# Patient Record
Sex: Male | Born: 2000 | Race: Black or African American | Hispanic: No | Marital: Single | Smoking: Never smoker
Health system: Southern US, Community
[De-identification: ages and names within clinical notes are randomized; demographics above are authoritative.]

## PROBLEM LIST (undated history)

## (undated) DIAGNOSIS — F909 Attention-deficit hyperactivity disorder, unspecified type: Secondary | ICD-10-CM

---
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
## 2019-06-02 ENCOUNTER — Other Ambulatory Visit: Payer: Self-pay

## 2019-06-02 ENCOUNTER — Encounter (HOSPITAL_COMMUNITY): Payer: Self-pay | Admitting: Emergency Medicine

## 2019-06-02 ENCOUNTER — Ambulatory Visit (HOSPITAL_COMMUNITY)
Admission: EM | Admit: 2019-06-02 | Discharge: 2019-06-02 | Disposition: A | Discharge status: Home / Self Care | Payer: 59 | Attending: Family Medicine | Admitting: Family Medicine

## 2019-06-02 DIAGNOSIS — J069 Acute upper respiratory infection, unspecified: Secondary | ICD-10-CM | POA: Diagnosis not present

## 2019-06-02 DIAGNOSIS — R059 Cough, unspecified: Secondary | ICD-10-CM

## 2019-06-02 DIAGNOSIS — J029 Acute pharyngitis, unspecified: Secondary | ICD-10-CM | POA: Diagnosis not present

## 2019-06-02 DIAGNOSIS — R05 Cough: Secondary | ICD-10-CM | POA: Insufficient documentation

## 2019-06-02 LAB — POCT RAPID STREP A: Streptococcus, Group A Screen (Direct): NEGATIVE

## 2019-06-02 MED ORDER — PROMETHAZINE-DM 6.25-15 MG/5ML PO SYRP: 5.0000 mL | ORAL_SOLUTION | Freq: Every evening | ORAL | 0 refills | Status: DC | PRN

## 2019-06-02 MED ORDER — BENZONATATE 100 MG PO CAPS: 100.0000 mg | ORAL_CAPSULE | Freq: Three times a day (TID) | ORAL | 0 refills | Status: DC | PRN

## 2019-06-02 NOTE — ED Triage Notes (Signed)
Pt here for sore throat onset 2 days associated w/headaches and fatigue  Last COVID test was on 05/24/2019 and it was negative  A&O x4... NAD.Marland Kitchen. ambulatory

## 2019-06-02 NOTE — ED Provider Notes (Signed)
[place]   MRN: [id number] DOB: 24-Jun-2000  Subjective:   Derek Black is a 19 y.o. male presenting for 2-day history of mild to moderate throat pain, nasal congestion, bilateral ear fullness and cough.  Patient had negative Covid testing.  He has used ginger tea, zinc and general supportive care at home.  He is not currently taking any medications and has no known food or drug allergies.  Denies past medical and surgical history.  History reviewed. No pertinent family history.  Social History   Tobacco Use  . Smoking status: Never Smoker  . Smokeless tobacco: Never Used  Substance Use Topics  . Alcohol use: Not Currently  . Drug use: Yes    Types: Marijuana    Review of Systems  Constitutional: Positive for chills. Negative for fever and malaise/fatigue.  HENT: Positive for congestion and sore throat. Negative for ear discharge, ear pain, sinus pain and tinnitus.   Eyes: Negative for discharge and redness.  Respiratory: Positive for cough. Negative for hemoptysis, shortness of breath and wheezing.   Cardiovascular: Negative for chest pain.  Gastrointestinal: Negative for abdominal pain, diarrhea, nausea and vomiting.  Genitourinary: Negative for dysuria, flank pain and hematuria.  Musculoskeletal: Negative for myalgias.  Skin: Negative for rash.  Neurological: Negative for dizziness, weakness and headaches.  Psychiatric/Behavioral: Negative for depression and substance abuse.     Objective:   Vitals: BP 116/73 (BP Location: Right Arm)   Pulse 94   Temp 98.8 F (37.1 C) (Oral)   Resp 16   SpO2 97%   Physical Exam Constitutional:      General: He is not in acute distress.    Appearance: Normal appearance. He is well-developed and normal weight. He is not ill-appearing, toxic-appearing or diaphoretic.  HENT:     Head: Normocephalic and atraumatic.     Right Ear: Tympanic membrane, ear canal and external ear normal. There is no impacted cerumen.   Left Ear: Tympanic membrane, ear canal and external ear normal. There is no impacted cerumen.     Nose: Nose normal. No congestion or rhinorrhea.     Mouth/Throat:     Mouth: Mucous membranes are moist.     Pharynx: Oropharynx is clear. No oropharyngeal exudate or posterior oropharyngeal erythema.  Eyes:     General: No scleral icterus.       Right eye: No discharge.        Left eye: No discharge.     Extraocular Movements: Extraocular movements intact.     Conjunctiva/sclera: Conjunctivae normal.     Pupils: Pupils are equal, round, and reactive to light.  Cardiovascular:     Rate and Rhythm: Normal rate and regular rhythm.     Heart sounds: Normal heart sounds. No murmur. No friction rub. No gallop.   Pulmonary:     Effort: Pulmonary effort is normal. No respiratory distress.     Breath sounds: Normal breath sounds. No stridor. No wheezing, rhonchi or rales.  Musculoskeletal:     Cervical back: Normal range of motion and neck supple. No rigidity. No muscular tenderness.  Neurological:     General: No focal deficit present.     Mental Status: He is alert and oriented to person, place, and time.  Psychiatric:        Mood and Affect: Mood normal.        Behavior: Behavior normal.        Thought Content: Thought content normal.     Results for orders placed  or performed during the hospital encounter of 06/02/19 (from the past 24 hour(s))  POCT rapid strep A [facility])     Status: None   Collection Time: 06/02/19 10:38 AM  Result Value Ref Range   Streptococcus, Group A Screen (Direct) NEGATIVE NEGATIVE    Assessment and Plan :   1. Viral URI with cough   2. Sore throat   3. Cough     Suspect viral URI, viral syndrome; physical exam findings reassuring and vital signs stable for discharge. Strep culture pending. Advised supportive care, offered symptomatic relief. Counseled patient on potential for adverse effects with medications prescribed/recommended today, ER and  return-to-clinic precautions discussed, patient verbalized understanding.     Wallis Bamberg, PA-C 06/02/19 1100

## 2019-06-02 NOTE — Discharge Instructions (Addendum)

## 2019-06-04 LAB — CULTURE, GROUP A STREP (THRC)

## 2019-07-17 ENCOUNTER — Other Ambulatory Visit: Payer: Self-pay

## 2019-07-17 ENCOUNTER — Ambulatory Visit (HOSPITAL_COMMUNITY)
Admission: EM | Admit: 2019-07-17 | Discharge: 2019-07-17 | Disposition: A | Discharge status: Home / Self Care | Payer: 59 | Attending: Family Medicine | Admitting: Family Medicine

## 2019-07-17 ENCOUNTER — Encounter (HOSPITAL_COMMUNITY): Payer: Self-pay

## 2019-07-17 DIAGNOSIS — J029 Acute pharyngitis, unspecified: Secondary | ICD-10-CM | POA: Diagnosis not present

## 2019-07-17 DIAGNOSIS — Z113 Encounter for screening for infections with a predominantly sexual mode of transmission: Secondary | ICD-10-CM | POA: Diagnosis not present

## 2019-07-17 DIAGNOSIS — Z20822 Contact with and (suspected) exposure to covid-19: Secondary | ICD-10-CM | POA: Insufficient documentation

## 2019-07-17 LAB — HIV ANTIBODY (ROUTINE TESTING W REFLEX): HIV Screen 4th Generation wRfx: NONREACTIVE

## 2019-07-17 LAB — RAPID HIV SCREEN (HIV 1/2 AB+AG)
HIV 1/2 Antibodies: NONREACTIVE
HIV-1 P24 Antigen - HIV24: NONREACTIVE

## 2019-07-17 NOTE — ED Provider Notes (Signed)
[place]   [id number] 07/17/19 Arrival Time: 6073   CC: COVID symptoms  SUBJECTIVE: History from: patient.  Derek Black is a 19 y.o. male who presents with abrupt onset of nasal congestion, PND, and persistent dry cough, sore throat for 2 days .  Denies sick exposure to COVID, flu or strep.  Denies recent travel.  Has tried no attempts for treatment at home. Denies previous symptoms in the past. Denies fever, chills, fatigue, sinus pain, rhinorrhea,  SOB, wheezing, chest pain, nausea, changes in bowel or bladder habits.   Patient requesting STD testing, no symptoms.         ROS: As per HPI.  All other pertinent ROS negative.     History reviewed. No pertinent past medical history. History reviewed. No pertinent surgical history. No Known Allergies No current facility-administered medications on file prior to encounter.   Current Outpatient Medications on File Prior to Encounter  Medication Sig Dispense Refill  . benzonatate (TESSALON) 100 MG capsule Take 1-2 capsules (100-200 mg total) by mouth 3 (three) times daily as needed. 60 capsule 0  . promethazine-dextromethorphan (PROMETHAZINE-DM) 6.25-15 MG/5ML syrup Take 5 mLs by mouth at bedtime as needed for cough. 100 mL 0   Social History   Socioeconomic History  . Marital status: Single    Spouse name: Not on file  . Number of children: Not on file  . Years of education: Not on file  . Highest education level: Not on file  Occupational History  . Not on file  Tobacco Use  . Smoking status: Never Smoker  . Smokeless tobacco: Never Used  Substance and Sexual Activity  . Alcohol use: Not Currently  . Drug use: Yes    Types: Marijuana  . Sexual activity: Not on file  Other Topics Concern  . Not on file  Social History Narrative  . Not on file   Social Determinants of Health   Financial Resource Strain:   . Difficulty of Paying Living Expenses: Not on file  Food Insecurity:   . Worried About Ship broker in the Last Year: Not on file  . Ran Out of Food in the Last Year: Not on file  Transportation Needs:   . Lack of Transportation (Medical): Not on file  . Lack of Transportation (Non-Medical): Not on file  Physical Activity:   . Days of Exercise per Week: Not on file  . Minutes of Exercise per Session: Not on file  Stress:   . Feeling of Stress : Not on file  Social Connections:   . Frequency of Communication with Friends and Family: Not on file  . Frequency of Social Gatherings with Friends and Family: Not on file  . Attends Religious Services: Not on file  . Active Member of Clubs or Organizations: Not on file  . Attends Archivist Meetings: Not on file  . Marital Status: Not on file  Intimate Partner Violence:   . Fear of Current or Ex-Partner: Not on file  . Emotionally Abused: Not on file  . Physically Abused: Not on file  . Sexually Abused: Not on file   Family History  Problem Relation Age of Onset  . Healthy Mother   . Diabetes Father     OBJECTIVE:  Vitals:   07/17/19 1609  BP: 114/69  Pulse: 72  Resp: 20  Temp: 98.8 F (37.1 C)  TempSrc: Oral  SpO2: 99%     General appearance: alert; appears fatigued, but nontoxic; speaking  in full sentences and tolerating own secretions HEENT: NCAT; Ears: EACs clear, TMs pearly gray; Eyes: PERRL.  EOM grossly intact. Sinuses: nontender; Nose: nares patent without rhinorrhea, Throat: oropharynx clear, tonsils non erythematous or enlarged, uvula midline  Neck: supple without LAD Lungs: unlabored respirations, symmetrical air entry; cough: absent; no respiratory distress; CTAB Heart: regular rate and rhythm.  Radial pulses 2+ symmetrical bilaterally Skin: warm and dry Psychological: alert and cooperative; normal mood and affect  LABS:  Results for orders placed or performed during the hospital encounter of 07/17/19 (from the past 24 hour(s))  Rapid HIV screen (HIV 1/2 Ab+Ag) ([facility] Only)     Status: None    Collection Time: 07/17/19  4:44 PM  Result Value Ref Range   HIV-1 P24 Antigen - HIV24 NON REACTIVE NON REACTIVE   HIV 1/2 Antibodies NON REACTIVE NON REACTIVE   Interpretation (HIV Ag Ab)      A non reactive test result means that HIV 1 or HIV 2 antibodies and HIV 1 p24 antigen were not detected in the specimen.     ASSESSMENT & PLAN:  1. Sore throat   2. Suspected COVID-19 virus infection   3. Screening examination for STD (sexually transmitted disease)     No orders of the defined types were placed in this encounter.   STD tests pending, will call with results and treat accordingly. Asymptomatic in office today.  COVID testing ordered.  It will take between 5-7 days for test results.  Someone will contact you regarding abnormal results.    In the meantime: You should remain isolated in your home for 10 days from symptom onset AND greater than 72 hours after symptoms resolution (absence of fever without the use of fever-reducing medication and improvement in respiratory symptoms), whichever is longer Get plenty of rest and push fluids Tessalon Perles prescribed for cough Use OTC zyrtec for nasal congestion, runny nose, and/or sore throat Use OTC flonase for nasal congestion and runny nose Use medications daily for symptom relief Use OTC medications like ibuprofen or tylenol as needed fever or pain Call or go to the ED if you have any new or worsening symptoms such as fever, worsening cough, shortness of breath, chest tightness, chest pain, turning blue, changes in mental status, etc...   Reviewed expectations re: course of current medical issues. Questions answered. Outlined signs and symptoms indicating need for more acute intervention. Patient verbalized understanding. After Visit Summary given.         Moshe Cipro, NP 07/17/19 1830

## 2019-07-17 NOTE — Discharge Instructions (Addendum)
Your COVID test is pending.  You should self quarantine until your test result is back and is negative.    Take Tylenol as needed for fever or discomfort.  Rest and keep yourself hydrated.    Go to the emergency department if you develop high fever, shortness of breath, severe diarrhea, or other concerning symptoms.    

## 2019-07-17 NOTE — ED Triage Notes (Signed)
Pt states sore throat, head pressure to forehead and face onset last night. Denies abd pain, n/v/d, body aches, congestion, loss of taste/smell.  Pt also request screening for STI. States had "yeast infection" a couple weeks which had penile discharge but has resolved.

## 2019-07-19 LAB — CYTOLOGY, (ORAL, ANAL, URETHRAL) ANCILLARY ONLY
Chlamydia: NEGATIVE
Neisseria Gonorrhea: NEGATIVE
Trichomonas: NEGATIVE

## 2019-07-19 LAB — NOVEL CORONAVIRUS, NAA (HOSP ORDER, SEND-OUT TO REF LAB; TAT 18-24 HRS): SARS-CoV-2, NAA: NOT DETECTED

## 2019-11-23 ENCOUNTER — Ambulatory Visit: Payer: 59 | Attending: Internal Medicine

## 2019-11-23 DIAGNOSIS — Z23 Encounter for immunization: Secondary | ICD-10-CM

## 2019-11-23 NOTE — Progress Notes (Signed)
   Covid-19 Vaccination Clinic  Name:  Derek Black    MRN: [id number] DOB: August 04, 2000  11/23/2019  [name] was observed post Covid-19 immunization for 15 minutes without incident. He was provided with Vaccine Information Sheet and instruction to access the V-Safe system.   [name] was instructed to call 911 with any severe reactions post vaccine: Marland Kitchen Difficulty breathing  . Swelling of face and throat  . A fast heartbeat  . A bad rash all over body  . Dizziness and weakness   Immunizations Administered    Name Date Dose VIS Date Route   Pfizer COVID-19 Vaccine 11/23/2019 11:21 AM 0.3 mL 07/26/2018 Intramuscular   Manufacturer: [employer], [employer]   Lot: [id number]   NDC: [address]-1

## 2021-08-29 ENCOUNTER — Ambulatory Visit: Payer: Self-pay | Admitting: Family Medicine

## 2021-09-19 ENCOUNTER — Ambulatory Visit: Payer: Self-pay | Admitting: Family Medicine

## 2022-05-08 ENCOUNTER — Ambulatory Visit: Payer: Self-pay | Admitting: Internal Medicine

## 2022-08-18 ENCOUNTER — Ambulatory Visit (HOSPITAL_COMMUNITY)
Admission: EM | Admit: 2022-08-18 | Discharge: 2022-08-18 | Disposition: A | Discharge status: Home / Self Care | Payer: Commercial Managed Care - HMO | Attending: Family Medicine | Admitting: Family Medicine

## 2022-08-18 ENCOUNTER — Encounter (HOSPITAL_COMMUNITY): Payer: Self-pay | Admitting: Emergency Medicine

## 2022-08-18 DIAGNOSIS — K122 Cellulitis and abscess of mouth: Secondary | ICD-10-CM | POA: Diagnosis not present

## 2022-08-18 HISTORY — DX: Attention-deficit hyperactivity disorder, unspecified type: F90.9

## 2022-08-18 MED ORDER — PREDNISONE 20 MG PO TABS: 40.0000 mg | ORAL_TABLET | Freq: Every day | ORAL | 0 refills | Status: DC

## 2022-08-18 NOTE — ED Triage Notes (Signed)
Pt c/o sore throat for 2-3 days. Throat lozenges and teas for pain

## 2022-08-19 NOTE — ED Provider Notes (Signed)
  [place]   [id number] 08/18/22 Arrival Time: [id number]  ASSESSMENT & PLAN:  1. Uvulitis    Afebrile. No signs of peritonsillar abscess. Discussed. Begin trial of: Meds ordered this encounter  Medications   predniSONE (DELTASONE) 20 MG tablet    Sig: Take 2 tablets (40 mg total) by mouth daily.    Dispense:  10 tablet    Refill:  0    OTC analgesics and throat care as needed   Reviewed expectations re: course of current medical issues. Questions answered. Outlined signs and symptoms indicating need for more acute intervention. Patient verbalized understanding. After Visit Summary given.   SUBJECTIVE:  Derek Black is a 22 y.o. male who reports a sore throat. Gradual onset; x 2-3 days. Feels uvula is swollen. Denies fever. Tolerating PO intake without n/v. Throat lozenge without much relief.   OBJECTIVE:  Vitals:   08/18/22 1047 08/18/22 1049  BP:  109/69  Pulse: 97 (!) 57  Resp:  15  Temp:  97.9 F (36.6 C)  TempSrc:  Oral  SpO2:  97%     General appearance: alert; no distress HEENT: throat with cobblestoning; uvula is midline but swollen Neck: supple with FROM; no lymphadenopathy Lungs: speaks full sentences without difficulty; unlabored Abd: soft; non-tender Skin: reveals no rash; warm and dry Psychological: alert and cooperative; normal mood and affect  No Known Allergies  Past Medical History:  Diagnosis Date   ADHD    Social History   Socioeconomic History   Marital status: Single    Spouse name: Not on file   Number of children: Not on file   Years of education: Not on file   Highest education level: Not on file  Occupational History   Not on file  Tobacco Use   Smoking status: Never   Smokeless tobacco: Never  Substance and Sexual Activity   Alcohol use: Not Currently   Drug use: Yes    Types: Marijuana   Sexual activity: Not on file  Other Topics Concern   Not on file  Social History Narrative   Not on file   Social  Determinants of Health   Financial Resource Strain: Not on file  Food Insecurity: Not on file  Transportation Needs: Not on file  Physical Activity: Not on file  Stress: Not on file  Social Connections: Not on file  Intimate Partner Violence: Not on file   Family History  Problem Relation Age of Onset   Healthy Mother    Diabetes Father            Vanessa Kick, MD 08/19/22 [phone]

## 2023-06-14 ENCOUNTER — Ambulatory Visit (HOSPITAL_COMMUNITY)
Admission: EM | Admit: 2023-06-14 | Discharge: 2023-06-15 | Disposition: A | Discharge status: Other Acute Inpatient Hospital | Payer: Commercial Managed Care - HMO | Attending: Psychiatry | Admitting: Psychiatry

## 2023-06-14 DIAGNOSIS — F909 Attention-deficit hyperactivity disorder, unspecified type: Secondary | ICD-10-CM | POA: Diagnosis not present

## 2023-06-14 DIAGNOSIS — F418 Other specified anxiety disorders: Secondary | ICD-10-CM | POA: Insufficient documentation

## 2023-06-14 DIAGNOSIS — R45851 Suicidal ideations: Secondary | ICD-10-CM

## 2023-06-14 DIAGNOSIS — F339 Major depressive disorder, recurrent, unspecified: Secondary | ICD-10-CM

## 2023-06-14 DIAGNOSIS — F338 Other recurrent depressive disorders: Secondary | ICD-10-CM | POA: Insufficient documentation

## 2023-06-14 DIAGNOSIS — T7621XD Adult sexual abuse, suspected, subsequent encounter: Secondary | ICD-10-CM | POA: Insufficient documentation

## 2023-06-14 LAB — CBC WITH DIFFERENTIAL/PLATELET
Abs Immature Granulocytes: 0.01 10*3/uL (ref 0.00–0.07)
Basophils Absolute: 0 10*3/uL (ref 0.0–0.1)
Basophils Relative: 0 %
Eosinophils Absolute: 0.2 10*3/uL (ref 0.0–0.5)
Eosinophils Relative: 2 %
HCT: 43.4 % (ref 39.0–52.0)
Hemoglobin: 14.8 g/dL (ref 13.0–17.0)
Immature Granulocytes: 0 %
Lymphocytes Relative: 41 %
Lymphs Abs: 2.7 10*3/uL (ref 0.7–4.0)
MCH: 30.8 pg (ref 26.0–34.0)
MCHC: 34.1 g/dL (ref 30.0–36.0)
MCV: 90.2 fL (ref 80.0–100.0)
Monocytes Absolute: 0.4 10*3/uL (ref 0.1–1.0)
Monocytes Relative: 6 %
Neutro Abs: 3.3 10*3/uL (ref 1.7–7.7)
Neutrophils Relative %: 51 %
Platelets: 217 10*3/uL (ref 150–400)
RBC: 4.81 MIL/uL (ref 4.22–5.81)
RDW: 12.4 % (ref 11.5–15.5)
WBC: 6.6 10*3/uL (ref 4.0–10.5)
nRBC: 0 % (ref 0.0–0.2)

## 2023-06-14 LAB — TSH: TSH: 0.877 u[IU]/mL (ref 0.350–4.500)

## 2023-06-14 LAB — POCT URINE DRUG SCREEN - MANUAL ENTRY (I-SCREEN)
POC Amphetamine UR: NOT DETECTED
POC Buprenorphine (BUP): NOT DETECTED
POC Cocaine UR: NOT DETECTED
POC Marijuana UR: POSITIVE — AB
POC Methadone UR: NOT DETECTED
POC Methamphetamine UR: NOT DETECTED
POC Morphine: NOT DETECTED
POC Oxazepam (BZO): NOT DETECTED
POC Oxycodone UR: NOT DETECTED
POC Secobarbital (BAR): NOT DETECTED

## 2023-06-14 LAB — COMPREHENSIVE METABOLIC PANEL
ALT: 25 U/L (ref 0–44)
AST: 22 U/L (ref 15–41)
Albumin: 4.3 g/dL (ref 3.5–5.0)
Alkaline Phosphatase: 72 U/L (ref 38–126)
Anion gap: 8 (ref 5–15)
BUN: 12 mg/dL (ref 6–20)
CO2: 27 mmol/L (ref 22–32)
Calcium: 9.5 mg/dL (ref 8.9–10.3)
Chloride: 102 mmol/L (ref 98–111)
Creatinine, Ser: 1.49 mg/dL — ABNORMAL HIGH (ref 0.61–1.24)
GFR, Estimated: >60 mL/min (ref >60)
Glucose, Bld: 97 mg/dL (ref 70–99)
Potassium: 4.3 mmol/L (ref 3.5–5.1)
Sodium: 137 mmol/L (ref 135–145)
Total Bilirubin: 0.6 mg/dL (ref 0.0–1.2)
Total Protein: 6.8 g/dL (ref 6.5–8.1)

## 2023-06-14 LAB — ETHANOL: Alcohol, Ethyl (B): <10 mg/dL (ref <10)

## 2023-06-14 MED ORDER — OLANZAPINE 10 MG IM SOLR: 10.0000 mg | Freq: Three times a day (TID) | INTRAMUSCULAR | Status: DC | PRN

## 2023-06-14 MED ORDER — OLANZAPINE 10 MG IM SOLR: 5.0000 mg | Freq: Three times a day (TID) | INTRAMUSCULAR | Status: DC | PRN

## 2023-06-14 MED ORDER — ACETAMINOPHEN 325 MG PO TABS: 650.0000 mg | ORAL_TABLET | Freq: Four times a day (QID) | ORAL | Status: DC | PRN

## 2023-06-14 MED ORDER — OLANZAPINE 5 MG PO TBDP: 5.0000 mg | ORAL_TABLET | Freq: Three times a day (TID) | ORAL | Status: DC | PRN

## 2023-06-14 MED ORDER — MAGNESIUM HYDROXIDE 400 MG/5ML PO SUSP: 30.0000 mL | Freq: Every day | ORAL | Status: DC | PRN

## 2023-06-14 MED ORDER — ALUM & MAG HYDROXIDE-SIMETH 200-200-20 MG/5ML PO SUSP: 30.0000 mL | ORAL | Status: DC | PRN
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
## 2023-06-14 NOTE — Progress Notes (Addendum)
 Pt was accepted to [facility]  06/14/2023 ; Bed Assignment 302 PENDING Signed Voluntary consent uploaded to chart or faxed to BMU FAX Number [phone], and admission orders with agitation protocol  Address: [address] [place], [place], [place] [address]  Pt meets inpatient criteria per [place] Trudy PIETY  Attending Physician will be Dr. Charlie Cam HAS  Report can be called to: [phone]  Pt can arrive after: [facility] and [facility] charge RN to coordinate with care team  Care Team notified:Night [facility] [place], Clarita Jones,RN, [place] Trudy PIETY Jackson Stover,L[facility], [place], Krista Gails, RN    Mitzie GEANNIE Pinal, MSW, [facility] 06/14/2023 10:08 PM
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
## 2023-06-14 NOTE — ED Notes (Signed)
 Patient is sitting in Assessment room 135. Patient's Aunt is in the room with him. He appears to be in no acute distress. At this time there are no available beds on the unit, therefore patient will be in assessment room until we are able to call and give report to nursing staff at [facility] which is where patient is to be transferred. Bertie has been signed. Patient and his aunt are aware of the pending transfer. This nurse has sent a secure chat to social worker Mitzie to make her aware that consent has been signed and that staff is ready to initiate transfer.

## 2023-06-15 ENCOUNTER — Other Ambulatory Visit: Payer: Self-pay

## 2023-06-15 ENCOUNTER — Inpatient Hospital Stay
Admission: AD | Admit: 2023-06-15 | Discharge: 2023-06-18 | DRG: 885 | Disposition: A | Discharge status: Home / Self Care | Payer: Commercial Managed Care - HMO | Source: Intra-hospital | Attending: Psychiatry | Admitting: Psychiatry

## 2023-06-15 DIAGNOSIS — Z833 Family history of diabetes mellitus: Secondary | ICD-10-CM

## 2023-06-15 DIAGNOSIS — R45851 Suicidal ideations: Secondary | ICD-10-CM | POA: Diagnosis present

## 2023-06-15 DIAGNOSIS — F339 Major depressive disorder, recurrent, unspecified: Secondary | ICD-10-CM | POA: Diagnosis not present

## 2023-06-15 DIAGNOSIS — Z818 Family history of other mental and behavioral disorders: Secondary | ICD-10-CM

## 2023-06-15 DIAGNOSIS — F418 Other specified anxiety disorders: Secondary | ICD-10-CM | POA: Diagnosis not present

## 2023-06-15 DIAGNOSIS — F332 Major depressive disorder, recurrent severe without psychotic features: Principal | ICD-10-CM | POA: Diagnosis present

## 2023-06-15 DIAGNOSIS — F909 Attention-deficit hyperactivity disorder, unspecified type: Secondary | ICD-10-CM | POA: Insufficient documentation

## 2023-06-15 DIAGNOSIS — F411 Generalized anxiety disorder: Secondary | ICD-10-CM | POA: Diagnosis present

## 2023-06-15 MED ORDER — OLANZAPINE 5 MG PO TBDP: 5.0000 mg | ORAL_TABLET | Freq: Three times a day (TID) | ORAL | Status: DC | PRN

## 2023-06-15 MED ORDER — MAGNESIUM HYDROXIDE 400 MG/5ML PO SUSP
30.0000 mL | Freq: Every day | ORAL | Status: DC | PRN
Start: 2023-06-15 — End: 2023-06-18

## 2023-06-15 MED ORDER — OLANZAPINE 10 MG IM SOLR: 10.0000 mg | Freq: Three times a day (TID) | INTRAMUSCULAR | Status: DC | PRN

## 2023-06-15 MED ORDER — SERTRALINE HCL 25 MG PO TABS
25.0000 mg | ORAL_TABLET | Freq: Every day | ORAL | Status: DC
  Administered 2023-06-16 – 2023-06-18 (×3): 25 mg via ORAL
  Filled 2023-06-15 (×3): qty 1

## 2023-06-15 MED ORDER — OLANZAPINE 10 MG IM SOLR: 5.0000 mg | Freq: Three times a day (TID) | INTRAMUSCULAR | Status: DC | PRN

## 2023-06-15 MED ORDER — ACETAMINOPHEN 325 MG PO TABS: 650.0000 mg | ORAL_TABLET | Freq: Four times a day (QID) | ORAL | Status: DC | PRN

## 2023-06-15 MED ORDER — ALUM & MAG HYDROXIDE-SIMETH 200-200-20 MG/5ML PO SUSP: 30.0000 mL | ORAL | Status: DC | PRN

## 2023-06-15 NOTE — Progress Notes (Signed)
   06/14/23 0300  Psychosocial Assessment  Patient Complaints Worrying;Tension;Depression  Eye Contact Brief  Facial Expression Flat  Affect Depressed  Speech Soft;Slow  Interaction Guarded  Motor Activity Hand-wringing  Appearance/Hygiene In scrubs  Behavior Characteristics Cooperative;Guarded  Mood Depressed  Aggressive Behavior  Targets Self  Type of Behavior Unprovoked  Effect Self-harm  Thought Process  Coherency Concrete thinking  Content Ambivalence;Preoccupation  Delusions None reported or observed  Perception Depersonalization  Hallucination None reported or observed  Judgment Poor  Confusion None  Danger to Self  Current suicidal ideation? Denies  Danger to Others  Danger to Others None reported or observed
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
## 2023-06-15 NOTE — BHH Counselor (Signed)
 CSW met with pt briefly to follow up regarding collateral/familial contact. Pt was informed that his aunt's number is not listed in the chart. CSW inquired if he would be ok with CSW contacting his parents. Pt inquired what information would be discussed. CSW reiterated the information around safety and any concerns that they may have. Pt stated that his parents are not at home and would not be helpful in this case. During interaction, pt appears very guarded and reluctant to have team communicate with his parents. No other concerns expressed. Contact ended without incident.   Nadara SAUNDERS. Chaim, MSW, LCSW, LCAS 06/15/2023 11:15 AM

## 2023-06-15 NOTE — Group Note (Signed)
 LCSW Group Therapy Note  Group Date: 06/15/2023 Start Time: 1300 End Time: 1405   Type of Therapy and Topic:  Group Therapy - Healthy vs Unhealthy Coping Skills  Participation Level:  Active   Description of Group The focus of this group was to determine what unhealthy coping techniques typically are used by group members and what healthy coping techniques would be helpful in coping with various problems. Patients were guided in becoming aware of the differences between healthy and unhealthy coping techniques. Patients were asked to identify 2-3 healthy coping skills they would like to learn to use more effectively.  Therapeutic Goals Patients learned that coping is what human beings do all day long to deal with various situations in their lives Patients defined and discussed healthy vs unhealthy coping techniques Patients identified their preferred coping techniques and identified whether these were healthy or unhealthy Patients determined 2-3 healthy coping skills they would like to become more familiar with and use more often. Patients provided support and ideas to each other   Summary of Patient Progress:  During group, Patient expressed slightly. Patient proved open to input from peers and feedback from CSW. Patient demonstrated proficient insight into the subject matter, was respectful of peers, and participated throughout the entire session.   Therapeutic Modalities Cognitive Behavioral Therapy Motivational Interviewing  Derek Black M Derek Black, LCSWA 06/15/2023  3:04 PM

## 2023-06-15 NOTE — Group Note (Signed)
 Date:  06/15/2023 Time:  10:13 AM  Group Topic/Focus:  Goals Group:   The focus of this group is to help patients establish daily goals to achieve during treatment and discuss how the patient can incorporate goal setting into their daily lives to aide in recovery.    Participation Level:  Active  Participation Quality:  Appropriate  Affect:  Appropriate  Cognitive:  Appropriate  Insight: Appropriate  Engagement in Group:  Engaged  Modes of Intervention:  Discussion, Education, and Support  Additional Comments:    Deitra Caron Mainland 06/15/2023, 10:13 AM

## 2023-06-15 NOTE — Progress Notes (Signed)
 Belongings sheet done and placed in patient's chart. Belonging placed in locker.

## 2023-06-15 NOTE — Progress Notes (Signed)
 Received patient @ 1:45 am. No acute distress. Patient appears depressed answering questions appropriately but, is noted to be guarded and stated that he is worrying about: not being able to leave his hospital to leave on my own terms. See flowsheet for details.
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
## 2023-06-15 NOTE — Progress Notes (Signed)
   06/15/23 0900  Psych Admission Type (Psych Patients Only)  Admission Status Voluntary  Psychosocial Assessment  Patient Complaints Depression  Eye Contact Brief  Facial Expression Flat  Affect Flat  Speech Soft  Interaction Minimal  Motor Activity Slow  Appearance/Hygiene In scrubs  Behavior Characteristics Cooperative  Mood Depressed  Thought Process  Coherency Circumstantial  Content Preoccupation  Delusions None reported or observed  Perception Depersonalization  Hallucination None reported or observed  Judgment Poor  Confusion None  Danger to Self  Current suicidal ideation? Denies  Danger to Others  Danger to Others None reported or observed

## 2023-06-15 NOTE — Group Note (Signed)
 Recreation Therapy Group Note   Group Topic:Health and Wellness  Group Date: 06/15/2023 Start Time: 1530 End Time: 1600 Facilitators: Celestia Jeoffrey BRAVO, LRT, CTRS Location:  Dayroom  Group Description: Seated Exercise. LRT discussed the mental and physical benefits of exercise. LRT and group discussed how physical activity can be used as a coping skill. Pt's and LRT followed along to an exercise video on the TV screen that provided a visual representation and audio description of every exercise performed. Pt's encouraged to listen to their bodies and stop at any time if they experience feelings of discomfort or pain. Pts were encouraged to drink water and stay hydrated.   Goal Area(s) Addressed: Patient will learn benefits of physical activity. Patient will identify exercise as a coping skill.  Patient will follow multistep directions. Patient will try a new leisure interest.    Affect/Mood: N/A   Participation Level: Did not attend    Clinical Observations/Individualized Feedback: Patient did not attend group.   Plan: Continue to engage patient in RT group sessions 2-3x/week.   Jeoffrey BRAVO Celestia, LRT, CTRS 06/15/2023 5:33 PM

## 2023-06-15 NOTE — Group Note (Signed)
 Date:  06/15/2023 Time:  8:53 PM  Group Topic/Focus:  Wrap-Up Group:   The focus of this group is to help patients review their daily goal of treatment and discuss progress on daily workbooks.    Participation Level:  Active  Participation Quality:  Appropriate and Attentive  Affect:  Appropriate  Cognitive:  Alert and Appropriate  Insight: Appropriate  Engagement in Group:  Engaged  Modes of Intervention:  Discussion and Orientation  Additional Comments:     Maglione,Tam Savoia E 06/15/2023, 8:53 PM

## 2023-06-15 NOTE — Group Note (Signed)
 Recreation Therapy Group Note   Group Topic:Relaxation  Group Date: 06/15/2023 Start Time: 1000 End Time: 1050 Facilitators: Celestia Jeoffrey BRAVO, LRT, CTRS Location:  Craft Room  Group Description: PMR (Progressive Muscle Relaxation). LRT asks patients their current level of stress/anxiety from 1-10, with 10 being the highest. LRT educates patients on what PMR is and the benefits that come from it. Patients are asked to sit with their feet flat on the floor while sitting up and all the way back in their chair, if possible. LRT and pts follow a prompt through a speaker that requires you to tense and release different muscles in their body and focus on their breathing. During session, lights are off and soft music is being played. Pts are given a stress ball to use if needed. At the end of the prompt, LRT asks patients to rank their current levels of stress/anxiety from 1-10, 10 being the highest. LRT provides patients with an education handout on PMR.   Goal Area(s) Addressed:  Patients will be able to describe progressive muscle relaxation.  Patient will practice using relaxation technique. Patient will identify a new coping skill.  Patient will follow multistep directions to reduce anxiety and stress.   Affect/Mood: Appropriate   Participation Level: Active and Engaged   Participation Quality: Independent   Behavior: Appropriate, Calm, and Cooperative   Speech/Thought Process: Coherent   Insight: Good   Judgement: Good   Modes of Intervention: Activity, Education, Exploration, and Guided Discussion   Patient Response to Interventions:  Attentive, Engaged, Interested , and Requested additional information/resources    Education Outcome:  Acknowledges education   Clinical Observations/Individualized Feedback: Demontrae was active in their participation of session activities and group discussion. Pt identified that his anxiety was 8 and stress was a 8.5 before the session. Afterwards,  he rated his anxiety a 5 and stress a 4. Pt requested and received a stress ball after group. Pt interacted well with LRT and peers duration of session.    Plan: Continue to engage patient in RT group sessions 2-3x/week.   Jeoffrey BRAVO Celestia, LRT, CTRS 06/15/2023 11:41 AM

## 2023-06-16 NOTE — BHH Counselor (Signed)
 CSW called [facility] at [phone]  [facility] requested that patient complete an updated intake. This is required for any appointments to be made on patient's behalf. CSW provided patient with intake forms along with his insurance information as he was having diffulty recalling.   Patient completed intake for [facility]. CSW faxed intake on patient's behalf to [phone].   CSW was instructed to follow up tomorrow morning 06/17/23 to schedule follow up appointment.   CSW team will continue to assess.   Male Iafrate, MSW, LCSWA 06/16/2023 3:56 PM

## 2023-06-16 NOTE — BH IP Treatment Plan (Signed)
 Interdisciplinary Treatment and Diagnostic Plan Update  06/16/2023 Time of Session: 09:12 Derek Black MRN: [id number]  Principal Diagnosis: Major depressive disorder, recurrent episode, severe (HCC)  Secondary Diagnoses: Principal Problem:   Major depressive disorder, recurrent episode, severe (HCC) Active Problems:   Suicidal ideation   Generalized anxiety disorder   Current Medications:  Current Facility-Administered Medications  Medication Dose Route Frequency Provider Last Rate Last Admin   acetaminophen  (TYLENOL ) tablet 650 mg  650 mg Oral Q6H PRN Dorthea Gauze, NP       alum & mag hydroxide-simeth (MAALOX/MYLANTA) 200-200-20 MG/5ML suspension 30 mL  30 mL Oral Q4H PRN Dorthea Gauze, NP       magnesium  hydroxide (MILK OF MAGNESIA) suspension 30 mL  30 mL Oral Daily PRN Dorthea Gauze, NP       OLANZapine  (ZYPREXA ) injection 10 mg  10 mg Intramuscular TID PRN Dorthea Gauze, NP       OLANZapine  (ZYPREXA ) injection 5 mg  5 mg Intramuscular TID PRN Dorthea Gauze, NP       OLANZapine  zydis (ZYPREXA ) disintegrating tablet 5 mg  5 mg Oral TID PRN Dorthea Gauze, NP       OLANZapine  zydis (ZYPREXA ) disintegrating tablet 5 mg  5 mg Oral TID PRN Dorthea Gauze, NP       sertraline  (ZOLOFT ) tablet 25 mg  25 mg Oral Daily Remington, Amber E, NP   25 mg at 06/16/23 1013   PTA Medications: Medications Prior to Admission  Medication Sig Dispense Refill Last Dose/Taking   amphetamine-dextroamphetamine (ADDERALL XR) 10 MG 24 hr capsule Take 10 mg by mouth daily.      predniSONE  (DELTASONE ) 20 MG tablet Take 2 tablets (40 mg total) by mouth daily. 10 tablet 0     Patient Stressors:    Patient Strengths:    Treatment Modalities: Medication Management, Group therapy, Case management,  1 to 1 session with clinician, Psychoeducation, Recreational therapy.   Physician Treatment Plan for Primary Diagnosis: Major depressive disorder, recurrent episode, severe (HCC) Long Term Goal(s):     Short  Term Goals:    Medication Management: Evaluate patient's response, side effects, and tolerance of medication regimen.  Therapeutic Interventions: 1 to 1 sessions, Unit Group sessions and Medication administration.  Evaluation of Outcomes: Progressing  Physician Treatment Plan for Secondary Diagnosis: Principal Problem:   Major depressive disorder, recurrent episode, severe (HCC) Active Problems:   Suicidal ideation   Generalized anxiety disorder  Long Term Goal(s):     Short Term Goals:       Medication Management: Evaluate patient's response, side effects, and tolerance of medication regimen.  Therapeutic Interventions: 1 to 1 sessions, Unit Group sessions and Medication administration.  Evaluation of Outcomes: Progressing   RN Treatment Plan for Primary Diagnosis: Major depressive disorder, recurrent episode, severe (HCC) Long Term Goal(s): Knowledge of disease and therapeutic regimen to maintain health will improve  Short Term Goals: Ability to remain free from injury will improve, Ability to verbalize frustration and anger appropriately will improve, Ability to demonstrate self-control, Ability to participate in decision making will improve, Ability to verbalize feelings will improve, Ability to disclose and discuss suicidal ideas, Ability to identify and develop effective coping behaviors will improve, and Compliance with prescribed medications will improve  Medication Management: RN will administer medications as ordered by provider, will assess and evaluate patient's response and provide education to patient for prescribed medication. RN will report any adverse and/or side effects to prescribing provider.  Therapeutic Interventions: 1 on 1 counseling sessions,  Psychoeducation, Medication administration, Evaluate responses to treatment, Monitor vital signs and CBGs as ordered, Perform/monitor CIWA, COWS, AIMS and Fall Risk screenings as ordered, Perform wound care treatments as  ordered.  Evaluation of Outcomes: Progressing   LCSW Treatment Plan for Primary Diagnosis: Major depressive disorder, recurrent episode, severe (HCC) Long Term Goal(s): Safe transition to appropriate next level of care at discharge, Engage patient in therapeutic group addressing interpersonal concerns.  Short Term Goals: Engage patient in aftercare planning with referrals and resources, Increase social support, Increase ability to appropriately verbalize feelings, Increase emotional regulation, Facilitate acceptance of mental health diagnosis and concerns, and Increase skills for wellness and recovery  Therapeutic Interventions: Assess for all discharge needs, 1 to 1 time with Social worker, Explore available resources and support systems, Assess for adequacy in community support network, Educate family and significant other(s) on suicide prevention, Complete Psychosocial Assessment, Interpersonal group therapy.  Evaluation of Outcomes: Progressing   Progress in Treatment: Attending groups: Yes. Participating in groups: Yes. Taking medication as prescribed: Yes. Toleration medication: Yes. Family/Significant other contact made: No, will contact:  when given permission. Patient understands diagnosis: Yes. Discussing patient identified problems/goals with staff: Yes. Medical problems stabilized or resolved: Yes. Denies suicidal/homicidal ideation: Yes. Issues/concerns per patient self-inventory: No. Other: none.  New problem(s) identified: No, Describe:  none identified.  New Short Term/Long Term Goal(s): medication management for mood stabilization; elimination of SI thoughts; development of comprehensive mental wellness plan.   Patient Goals: "Stabilize myself, get myself some better coping mechanisms."   Discharge Plan or Barriers: CSW will assist pt with development of an appropriate aftercare/discharge plan.   Reason for Continuation of Hospitalization:  Anxiety Depression Medication stabilization Suicidal ideation  Estimated Length of Stay: 1-7 days  Last 3 [place] Suicide Severity Risk Score: Flowsheet Row Admission (Current) from 06/15/2023 in [facility] INPATIENT BE[facility]AL MEDICINE ED from 06/14/2023 in [facility] ED from 08/18/2022 in [facility] RISK CATEGORY High Risk High Risk No Risk       Last PHQ 2/9 Scores:     No data to display          Scribe for Treatment Team: Randolm Butte, LCSW 06/16/2023 10:19 AM

## 2023-06-16 NOTE — Group Note (Unsigned)
 Date:  06/16/2023 Time:  9:42 PM  Group Topic/Focus:  Wellness Toolbox:   The focus of this group is to discuss various aspects of wellness, balancing those aspects and exploring ways to increase the ability to experience wellness.  Patients will create a wellness toolbox for use upon discharge.     Participation Level:  {BHH PARTICIPATION BJYNW:29562}  Participation Quality:  {BHH PARTICIPATION QUALITY:22265}  Affect:  {BHH AFFECT:22266}  Cognitive:  {BHH COGNITIVE:22267}  Insight: {BHH Insight2:20797}  Engagement in Group:  {BHH ENGAGEMENT IN ZHYQM:57846}  Modes of Intervention:  {BHH MODES OF INTERVENTION:22269}  Additional Comments:  ***  Fabiola Holy 06/16/2023, 9:42 PM
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
## 2023-06-16 NOTE — BHH Suicide Risk Assessment (Signed)
 BHH INPATIENT:  Family/Significant Other Suicide Prevention Education  Suicide Prevention Education:  Patient Refusal for Family/Significant Other Suicide Prevention Education: The patient Derek Black has refused to provide written consent for family/significant other to be provided Family/Significant Other Suicide Prevention Education during admission and/or prior to discharge.  Physician notified.  SPE completed with pt, as pt refused to consent to family contact. SPI pamphlet provided to pt and pt was encouraged to share information with support network, ask questions, and talk about any concerns relating to SPE. Pt denies access to guns/firearms and verbalized understanding of information provided. Mobile Crisis information also provided to pt.  Derek Black 06/16/2023, 2:34 PM

## 2023-06-16 NOTE — Group Note (Deleted)
 Date:  06/16/2023 Time:  11:15 AM  Group Topic/Focus:  Self Esteem Action Plan:   The focus of this group is to help patients create a plan to continue to build self-esteem after discharge.     Participation Level:  {BHH PARTICIPATION XWRUE:45409}  Participation Quality:  {BHH PARTICIPATION QUALITY:22265}  Affect:  {BHH AFFECT:22266}  Cognitive:  {BHH COGNITIVE:22267}  Insight: {BHH Insight2:20797}  Engagement in Group:  {BHH ENGAGEMENT IN WJXBJ:47829}  Modes of Intervention:  {BHH MODES OF INTERVENTION:22269}  Additional Comments:  ***  Dow Gemma 06/16/2023, 11:15 AM

## 2023-06-16 NOTE — Plan of Care (Signed)
  Problem: Education: Goal: Knowledge of [facility] Education information/materials will improve 06/16/2023 1923 by Jeanmarie Millet, RN Outcome: Progressing 06/16/2023 0524 by Jeanmarie Millet, RN Outcome: Progressing Goal: Emotional status will improve Outcome: Progressing   Problem: Activity: Goal: Interest or engagement in activities will improve Outcome: Progressing Goal: Sleeping patterns will improve Outcome: Progressing

## 2023-06-16 NOTE — Progress Notes (Signed)
   06/16/23 1400  Psych Admission Type (Psych Patients Only)  Admission Status Voluntary  Psychosocial Assessment  Patient Complaints Depression  Eye Contact Brief  Facial Expression Flat  Affect Flat  Speech Soft  Interaction Minimal  Motor Activity Slow  Appearance/Hygiene In scrubs  Behavior Characteristics Cooperative  Mood Depressed  Thought Process  Coherency Circumstantial  Content Preoccupation  Delusions None reported or observed  Perception Depersonalization  Hallucination None reported or observed  Judgment Poor  Confusion None  Danger to Self  Current suicidal ideation? Denies  Danger to Others  Danger to Others None reported or observed

## 2023-06-16 NOTE — Progress Notes (Signed)
 Patient denies SI,HI,AVH and pain. Flat expression. Minimal interaction with patient. Stayed in room.   06/15/23 2300  Psych Admission Type (Psych Patients Only)  Admission Status Voluntary  Psychosocial Assessment  Patient Complaints Depression  Eye Contact Brief  Facial Expression Flat  Affect Flat  Speech Soft  Interaction Minimal  Motor Activity Slow  Appearance/Hygiene In scrubs  Behavior Characteristics Cooperative  Mood Depressed  Aggressive Behavior  Effect No apparent injury  Thought Process  Coherency Concrete thinking  Content Preoccupation  Delusions None reported or observed  Perception Depersonalization  Hallucination None reported or observed  Judgment Poor  Confusion None  Danger to Self  Current suicidal ideation? Denies  Danger to Others  Danger to Others None reported or observed

## 2023-06-16 NOTE — Group Note (Signed)
 Date:  06/16/2023 Time:  9:59 PM  Group Topic/Focus:  Wellness Toolbox:   The focus of this group is to discuss various aspects of wellness, balancing those aspects and exploring ways to increase the ability to experience wellness.  Patients will create a wellness toolbox for use upon discharge.    Participation Level:  Active  Participation Quality:  Appropriate, Attentive, Sharing, and Supportive  Affect:  Appropriate  Cognitive:  Appropriate  Insight: Appropriate and Good  Engagement in Group:  Engaged and Supportive  Modes of Intervention:  Discussion and Support  Additional Comments:     Derek Black 06/16/2023, 9:59 PM

## 2023-06-16 NOTE — Plan of Care (Signed)
   Problem: Education: Goal: Knowledge of [place] [place] [facility] Education information/materials will improve Outcome: Progressing Goal: Emotional status will improve Outcome: Progressing

## 2023-06-17 ENCOUNTER — Inpatient Hospital Stay: Admit: 2023-06-17 | Payer: Commercial Managed Care - HMO

## 2023-06-17 NOTE — Group Note (Signed)
Recreation Therapy Group Note   Group Topic:Coping Skills  Group Date: 06/17/2023 Start Time: 1000 End Time: 1045 Facilitators: Rosina Lowenstein, LRT, CTRS Location:  Craft Room  Group Description: Mind Map.  Patient was provided a blank template of a diagram with 32 blank boxes in a tiered system, branching from the center (similar to a bubble chart). LRT directed patients to label the middle of the diagram "Coping Skills". LRT and patients then came up with 8 different coping skills as examples. Pt were directed to record their coping skills in the 2nd tier boxes closest to the center.  Patients would then share their coping skills with the group as LRT wrote them out. LRT gave a handout of 99 different coping skills at the end of group.   Goal Area(s) Addressed: Patients will be able to define "coping skills". Patient will identify new coping skills.  Patient will increase communication.   Affect/Mood: Appropriate   Participation Level: Active and Engaged   Participation Quality: Independent   Behavior: Appropriate, Calm, and Cooperative   Speech/Thought Process: Coherent   Insight: Good   Judgement: Good   Modes of Intervention: Clarification, Education, Guided Discussion, and Worksheet   Patient Response to Interventions:  Attentive, Engaged, Interested , and Receptive   Education Outcome:  Acknowledges education   Clinical Observations/Individualized Feedback: Derek Black was active in their participation of session activities and group discussion. Pt identified "crying, self care, swimming, reading, video games, and positive affirmations" as coping skills. Pt spontaneously contributed to group discussion and asked different clarifying questions on coping skills in general. Pt interacted well with LRT and peers duration of session.    Plan: Continue to engage patient in RT group sessions 2-3x/week.   Rosina Lowenstein, LRT, CTRS 06/17/2023 12:11 PM

## 2023-06-17 NOTE — Progress Notes (Signed)
   06/17/23 0823  Psych Admission Type (Psych Patients Only)  Admission Status Voluntary  Psychosocial Assessment  Patient Complaints Depression  Eye Contact Fair  Facial Expression Flat  Affect Anxious  Speech Soft  Interaction Minimal  Motor Activity Slow  Appearance/Hygiene In scrubs  Behavior Characteristics Cooperative  Mood Depressed  Thought Process  Coherency Concrete thinking  Content Preoccupation  Delusions None reported or observed  Perception Depersonalization  Hallucination None reported or observed  Judgment Poor  Confusion None  Danger to Self  Current suicidal ideation? Denies  Danger to Others  Danger to Others None reported or observed

## 2023-06-17 NOTE — BHH Counselor (Signed)
CSW contacted [facility] [phone]) to follow up about scheduling appointment. CSW was informed that they have not received the intake paperwork. Fax number was confirmed as [phone]. No other concerns expressed. Contact ended without incident.   CSW faxed intake paperwork over to [phone] again. CSW will follow up to make sure they receive paperwork later in the day.   Vilma Meckel. Algis Greenhouse, MSW, LCSW, LCAS 06/17/2023 11:28 AM

## 2023-06-17 NOTE — Group Note (Signed)
Date:  06/17/2023 Time:  8:58 PM  Group Topic/Focus:  Wellness Toolbox:   The focus of this group is to discuss various aspects of wellness, balancing those aspects and exploring ways to increase the ability to experience wellness.  Patients will create a wellness toolbox for use upon discharge.    Participation Level:  Active  Participation Quality:  Appropriate  Affect:  Appropriate  Cognitive:  Appropriate  Insight: Appropriate  Engagement in Group:  Engaged  Modes of Intervention:  Discussion    Derek Black 06/17/2023, 8:58 PM

## 2023-06-17 NOTE — Progress Notes (Signed)
   06/16/23 1923  Psych Admission Type (Psych Patients Only)  Admission Status Voluntary  Psychosocial Assessment  Patient Complaints Depression  Eye Contact Brief  Facial Expression Flat  Affect Flat  Speech Soft  Interaction Minimal  Motor Activity Slow  Appearance/Hygiene In scrubs  Behavior Characteristics Cooperative  Mood Depressed  Aggressive Behavior  Effect No apparent injury  Thought Process  Coherency Concrete thinking  Content Preoccupation  Delusions None reported or observed  Perception Depersonalization  Hallucination None reported or observed  Judgment Poor  Confusion None  Danger to Self  Current suicidal ideation? Denies  Danger to Others  Danger to Others None reported or observed
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
## 2023-06-17 NOTE — Group Note (Signed)
Recreation Therapy Group Note   Group Topic:Relaxation  Group Date: 06/17/2023 Start Time: 1530 End Time: 1605 Facilitators: Rosina Lowenstein, LRT, CTRS Location:  Dayroom  Group Description: Meditation. LRT and patients discussed what they know about meditation and mindfulness. LRT played a Deep Breathing Meditation exercise script for patients to follow along to. LRT and patients discussed how meditation and deep breathing can be used as a coping skill post--discharge to help manage symptoms of stress.   Goal Area(s) Addressed: Patient will practice using relaxation technique. Patient will identify a new coping skill.  Patient will follow multistep directions to reduce anxiety and stress.  Affect/Mood: Appropriate   Participation Level: Active and Engaged   Participation Quality: Independent   Behavior: Appropriate, Calm, and Cooperative   Speech/Thought Process: Coherent   Insight: Good   Judgement: Good   Modes of Intervention: Activity and Education   Patient Response to Interventions:  Attentive, Engaged, and Receptive   Education Outcome:  Acknowledges education   Clinical Observations/Individualized Feedback: Derek Black was active in their participation of session activities and group discussion. Pt identified "I was nervous thinking about going home but this helped calm me down". Pt also shared that he has done exercises like this before. Pt completed all exercises appropriately while interacting well with LRT and peers duration of session.    Plan: Continue to engage patient in RT group sessions 2-3x/week.   Rosina Lowenstein, LRT, CTRS 06/17/2023 5:32 PM

## 2023-06-17 NOTE — BHH Counselor (Signed)
CSW phoned [facility] [phone]) to follow up about receipt of intake paperwork. Contact was unable to be established but HIPAA compliant voicemail left with contact information for follow through.   Vilma Meckel. Algis Greenhouse, MSW, LCSW, LCAS 06/17/2023 3:25 PM

## 2023-06-17 NOTE — Plan of Care (Signed)
°  Problem: Education: °Goal: Emotional status will improve °Outcome: Progressing °Goal: Mental status will improve °Outcome: Progressing °Goal: Verbalization of understanding the information provided will improve °Outcome: Progressing °  °

## 2023-06-17 NOTE — Group Note (Signed)
[facility] LCSW Group Therapy Note   Group Date: 06/17/2023 Start Time: 1320 End Time: 1400   Type of Therapy/Topic:  Group Therapy:  Balance in Life  Participation Level:  Active   Description of Group:    This group will address the concept of balance and how it feels and looks when one is unbalanced. Patients will be encouraged to process areas in their lives that are out of balance, and identify reasons for remaining unbalanced. Facilitators will guide patients utilizing problem- solving interventions to address and correct the stressor making their life unbalanced. Understanding and applying boundaries will be explored and addressed for obtaining  and maintaining a balanced life. Patients will be encouraged to explore ways to assertively make their unbalanced needs known to significant others in their lives, using other group members and facilitator for support and feedback.  Therapeutic Goals: Patient will identify two or more emotions or situations they have that consume much of in their lives. Patient will identify signs/triggers that life has become out of balance:  Patient will identify two ways to set boundaries in order to achieve balance in their lives:  Patient will demonstrate ability to communicate their needs through discussion and/or role plays  Summary of Patient Progress: Patient was present for the entirety of the group process. He identified family as something that can cause him to become off balanced. Pt shared some issues that he was dealing within his familial relationships. Betrayal was discussed and he identified that he typically deals with this by talking to someone who he knows wants to be in his life. Listening to the bible and other uplifting things were also noted as ways that he maintains balance in life. Pt has significant insight into himself and the topic. He appeared open and receptive to feedback/comments from both his peers and facilitator.   Therapeutic  Modalities:   Cognitive Behavioral Therapy Solution-Focused Therapy Assertiveness Training   Glenis Smoker, LCSW

## 2023-06-18 MED ORDER — SERTRALINE HCL 25 MG PO TABS: 25.0000 mg | ORAL_TABLET | Freq: Every day | ORAL | 0 refills | Status: AC

## 2023-06-18 NOTE — BHH Counselor (Addendum)
CSW met with pt briefly to discuss aftercare. He was given the contact information for [facility] [phone]) to call to schedule follow up appointment for continued medication management. No other concerns expressed. Contact ended without incident.   Vilma Meckel. Algis Greenhouse, MSW, LCSW, LCAS 06/18/2023 11:06 AM  Addendum: CSW met with pt to follow up regarding contact with [facility]. He shared that the call did not go through and that he would just take care of it after discharge. CSW informed him that he would also put their information on his discharge paperwork with a note that pt would call. He agreed. Pt inquired regarding when he would be discharged. CSW informed him that the provider was working on his discharge and that the nurse would let him know when to call his ride to come get him. He voiced understanding. No other concerns expressed. Contact ended without incident.   Vilma Meckel. Algis Greenhouse, MSW, LCSW, LCAS 06/18/2023 11:18 AM

## 2023-06-18 NOTE — Group Note (Signed)
Date:  06/18/2023 Time:  10:08 AM  Group Topic/Focus:  Overcoming Stress:   The focus of this group is to define stress and help patients assess their triggers.    Participation Level:  Did Not Attend   Derek Black 06/18/2023, 10:08 AM

## 2023-06-18 NOTE — Group Note (Signed)
Recreation Therapy Group Note   Group Topic:Leisure Education  Group Date: 06/18/2023 Start Time: 1000 End Time: 1100 Facilitators: Rosina Lowenstein, LRT, CTRS Location:  Craft Room  Group Description: Leisure. Patients were given the option to choose from singing karaoke, coloring mandalas, using oil pastels, journaling, or playing with play-doh. LRT and pts discussed the meaning of leisure, the importance of participating in leisure during their free time/when they're outside of the hospital, as well as how our leisure interests can also serve as coping skills.   Goal Area(s) Addressed:  Patient will identify a current leisure interest.  Patient will learn the definition of "leisure". Patient will practice making a positive decision. Patient will have the opportunity to try a new leisure activity. Patient will communicate with peers and LRT.    Affect/Mood: Appropriate   Participation Level: Active and Engaged   Participation Quality: Independent   Behavior: Appropriate, Calm, and Cooperative   Speech/Thought Process: Coherent   Insight: Good   Judgement: Good   Modes of Intervention: Clarification, Education, Exploration, Guided Discussion, and Rapport Building   Patient Response to Interventions:  Attentive, Engaged, and Interested    Education Outcome:  Acknowledges education   Clinical Observations/Individualized Feedback: Derek Black was active in their participation of session activities and group discussion. Pt identified "writing and going for a walk" as things he does in his free time. Pt chose to draw while in group. Pt interacted well with LRT and peers duration of session.    Plan: Continue to engage patient in RT group sessions 2-3x/week.   Rosina Lowenstein, LRT, CTRS 06/18/2023 11:40 AM

## 2023-06-18 NOTE — Progress Notes (Signed)
  [facility] Adult Case Management Discharge Plan :  Will you be returning to the same living situation after discharge:  Yes,  pt returning home upon discharge.  At discharge, do you have transportation home?: Yes,  parent to provide transportation home.  Do you have the ability to pay for your medications: Yes,  [employer].  Release of information consent forms completed and in the chart;  Patient's signature needed at discharge.  Patient to Follow up at:  Follow-up Information     [facility] Go to.   Specialty: Professional Counselor Why: You have an appointment on 06/21/22 at 10AM with Minna Merritts. Contact information: [address] [place] [address] [phone]         [facility] Call.   Why: Call provider to schedule follow up for continued medication management. Contact information: [address] [place] [address] [phone]                 Next level of care provider has access to [facility] Link:no  Safety Planning and Suicide Prevention discussed: Yes,  SPE completed with pt.      Has patient been referred to the Quitline?: Patient does not use tobacco/nicotine products  Patient has been referred for addiction treatment: No known substance use disorder.  Glenis Smoker, LCSW 06/18/2023, 11:19 AM

## 2023-06-18 NOTE — Progress Notes (Signed)
   06/18/23 0900  Psych Admission Type (Psych Patients Only)  Admission Status Voluntary  Psychosocial Assessment  Patient Complaints Anxiety  Eye Contact Brief  Facial Expression Flat  Affect Flat  Speech Soft  Interaction Minimal  Motor Activity Slow  Appearance/Hygiene In scrubs  Behavior Characteristics Cooperative  Mood Pleasant  Thought Process  Coherency Concrete thinking  Content Preoccupation  Delusions None reported or observed  Perception WDL  Hallucination None reported or observed  Judgment Impaired  Confusion None  Danger to Self  Current suicidal ideation? Denies  Danger to Others  Danger to Others None reported or observed   Patient discharged at this time with all discharge instructions and belongings given to patient with acknowledgment. Patient picked up by mom to home.

## 2023-06-18 NOTE — Discharge Summary (Signed)
Physician Discharge Summary Note  Patient:  Derek Black is an 23 y.o., male MRN:  [id number] DOB:  05/01/2001 Patient phone:  [phone] (home)  Patient address:   [address] [place] [address],  Total Time spent with patient: 45 minutes  Date of Admission:  06/15/2023 Date of Discharge: 06/18/2023  Reason for Admission:  Suicidal ideation   Principal Problem: Major depressive disorder, recurrent episode, severe (HCC) Discharge Diagnoses: Principal Problem:   Major depressive disorder, recurrent episode, severe (HCC) Active Problems:   Suicidal ideation   Generalized anxiety disorder   Past Psychiatric History: see h&p  Past Medical History:  Past Medical History:  Diagnosis Date   ADHD    No past surgical history on file. Family History:  Family History  Problem Relation Age of Onset   Healthy Mother    Diabetes Father    Family Psychiatric  History: see h&p Social History:  Social History   Substance and Sexual Activity  Alcohol Use Not Currently     Social History   Substance and Sexual Activity  Drug Use Yes   Types: Marijuana    Social History   Socioeconomic History   Marital status: Single    Spouse name: Not on file   Number of children: Not on file   Years of education: Not on file   Highest education level: Not on file  Occupational History   Not on file  Tobacco Use   Smoking status: Never   Smokeless tobacco: Never  Substance and Sexual Activity   Alcohol use: Not Currently   Drug use: Yes    Types: Marijuana   Sexual activity: Not on file  Other Topics Concern   Not on file  Social History Narrative   Not on file   Social Drivers of Health   Financial Resource Strain: Not on file  Food Insecurity: No Food Insecurity (06/15/2023)   Hunger Vital Sign    Worried About Running Out of Food in the Last Year: Never true    Ran Out of Food in the Last Year: Never true  Transportation Needs: No Transportation Needs (06/14/2023)    PRAPARE - Administrator, Civil Service (Medical): No    Lack of Transportation (Non-Medical): No  Physical Activity: Not on file  Stress: Not on file  Social Connections: Not on file    Hospital Course:  Derek Black is a 23 year old male with a reported history of ADHD and dysthymia who presented voluntarily to [facility] via Patent examiner for suicidal thoughts with a plan to use father's gun to kill himself. During hospitalization, the patient was placed on Q 15 minute monitoring and suicide precautions. He received treatment including medication management, supportive therapy, group sessions, and recreation therapy. The patient was compliant with treatment. Pharmacologic management included sertraline for major depressive disorder and generalized anxiety disorder. The patient showed positive response to treatment and no side effects were noted or observed. Throughout the hospital course, the patient's mood improved, anxiety decreased, and insight and judgement improved. The patient demonstrated progress in coping skills and open communication with his parents. By discharge, he demonstrated resolution of acute symptoms and was deemed appropriate to discharge home with family. Firearms were removed from the home and patient was educated on medication adherence, relapse prevention, and crisis management. He participated in safety planning.  Physical Findings: AIMS:  , ,  ,  ,    CIWA:    COWS:     Musculoskeletal: Strength &  Muscle Tone: within normal limits Gait & Station: normal Patient leans: N/A   Psychiatric Specialty Exam:  Presentation  General Appearance:  Appropriate for Environment  Eye Contact: Good  Speech: Clear and Coherent  Speech Volume: Normal  Handedness: Right   Mood and Affect  Mood: Euthymic  Affect: Appropriate; Congruent   Thought Process  Thought Processes: Coherent; Linear  Descriptions of  Associations:Intact  Orientation:Full (Time, Place and Person)  Thought Content:Logical  History of Schizophrenia/Schizoaffective disorder:No  Duration of Psychotic Symptoms:No data recorded Hallucinations:No data recorded Ideas of Reference:None  Suicidal Thoughts:Suicidal Thoughts: No  Homicidal Thoughts:Homicidal Thoughts: No   Sensorium  Memory: Immediate Good; Recent Good; Remote Good  Judgment: Good  Insight: Good   Executive Functions  Concentration: Good  Attention Span: Good  Recall: Good  Fund of Knowledge: Good  Language: Good   Psychomotor Activity  Psychomotor Activity: Psychomotor Activity: Normal   Assets  Assets: Communication Skills; Desire for Improvement; Financial Resources/Insurance; Housing; Physical Health; Resilience; Social Support; Vocational/Educational   Sleep  Sleep: Sleep: Good    Physical Exam: Physical Exam Vitals and nursing note reviewed.  HENT:     Nose: Nose normal.  Pulmonary:     Effort: Pulmonary effort is normal.  Musculoskeletal:        General: Normal range of motion.     Cervical back: Normal range of motion.  Skin:    General: Skin is warm and dry.  Neurological:     Mental Status: He is alert and oriented to person, place, and time.  Psychiatric:        Mood and Affect: Mood normal.        Behavior: Behavior normal.        Thought Content: Thought content normal.        Judgment: Judgment normal.    ROS Blood pressure 117/74, pulse (!) 56, temperature (!) 97.5 F (36.4 C), resp. rate 20, height 5\' 10"  (1.778 m), weight 101.6 kg, SpO2 100%. Body mass index is 32.14 kg/m.   Social History   Tobacco Use  Smoking Status Never  Smokeless Tobacco Never   Tobacco Cessation:  N/A, patient does not currently use tobacco products   Blood Alcohol level:  Lab Results  Component Value Date   ETH <10 06/14/2023    Metabolic Disorder Labs:  No results found for: "HGBA1C", "MPG" No  results found for: "PROLACTIN" No results found for: "CHOL", "TRIG", "HDL", "CHOLHDL", "VLDL", "LDLCALC"  See Psychiatric Specialty Exam and Suicide Risk Assessment completed by Attending Physician prior to discharge.  Discharge destination:  Home  Is patient on multiple antipsychotic therapies at discharge:  No   Has Patient had three or more failed trials of antipsychotic monotherapy by history:  No  Recommended Plan for Multiple Antipsychotic Therapies: NA  Medications: sertraline 25mg  by mouth daily for major depressive disorder    Follow-up Information     [facility] Go to.   Specialty: Professional Counselor Why: You have an appointment on 06/21/22 at 10AM with Minna Merritts. Contact information: [address] [place] [address] [phone]                 Follow-up recommendations:     - It is recommended to the patient to continue psychiatric medications as prescribed, after discharge from the hospital.   - It is recommended to the patient to follow up with your outpatient psychiatric provider and PCP. - It was discussed with the patient, the impact of  alcohol, drugs, tobacco have been there overall psychiatric and medical wellbeing, and total abstinence from substance use was recommended the patient. - Prescriptions provided or sent directly to preferred pharmacy at discharge. Patient agreeable to plan. Given opportunity to ask questions. Appears to feel comfortable with discharge.   - In the event of worsening symptoms, the patient is instructed to call the crisis hotline, 911 and or go to the nearest ED for appropriate evaluation and treatment of symptoms. To follow-up with primary care provider for other medical issues, concerns and or health care needs - Patient was discharged home with a plan to follow up as noted below.   Signed: Lovette Cliche, NP 06/18/2023, 10:08 AM

## 2023-06-18 NOTE — Progress Notes (Signed)
Suicide Risk Assessment  Discharge Assessment    [facility] Discharge Suicide Risk Assessment   Principal Problem: Major depressive disorder, recurrent episode, severe (HCC) Discharge Diagnoses: Principal Problem:   Major depressive disorder, recurrent episode, severe (HCC) Active Problems:   Suicidal ideation   Generalized anxiety disorder   Total Time spent with patient: 45 minutes  Musculoskeletal: Strength & Muscle Tone: within normal limits Gait & Station: normal Patient leans: N/A  Psychiatric Specialty Exam  Presentation  General Appearance:  Appropriate for Environment  Eye Contact: Good  Speech: Clear and Coherent  Speech Volume: Normal  Handedness: Right   Mood and Affect  Mood: Anxious; Depressed; Worthless  Duration of Depression Symptoms: N/A  Affect: Congruent; Depressed   Thought Process  Thought Processes: Coherent  Descriptions of Associations:Intact  Orientation:Full (Time, Place and Person)  Thought Content:Logical  History of Schizophrenia/Schizoaffective disorder:No  Duration of Psychotic Symptoms:No data recorded Hallucinations:No data recorded Ideas of Reference:None  Suicidal Thoughts:No data recorded Homicidal Thoughts:No data recorded  Sensorium  Memory: Immediate Good; Recent Good; Remote Good  Judgment: Fair  Insight: Fair   Chartered certified accountant: Fair  Attention Span: Fair  Recall: Fair  Fund of Knowledge: Good  Language: Fair   Psychomotor Activity  Psychomotor Activity:No data recorded  Assets  Assets: Communication Skills; Desire for Improvement; Housing; Physical Health; Social Support; Vocational/Educational   Sleep  Sleep:No data recorded  Physical Exam: Physical Exam ROS Blood pressure 117/74, pulse (!) 56, temperature (!) 97.5 F (36.4 C), resp. rate 20, height 5\' 10"  (1.778 m), weight 101.6 kg, SpO2 100%. Body mass index is 32.14 kg/m.  Mental Status Per  Nursing Assessment::   On Admission:  Suicidal ideation indicated by patient  Demographic Factors:  Adolescent or young adult and Gay, lesbian, or bisexual orientation  Loss Factors: NA  Historical Factors: Family history of mental illness or substance abuse  Risk Reduction Factors:   Sense of responsibility to family, Living with another person, especially a relative, Positive social support, Positive therapeutic relationship, and Positive coping skills or problem solving skills  Continued Clinical Symptoms:  Dysthymia  Cognitive Features That Contribute To Risk:  None    Suicide Risk:  Minimal: No identifiable suicidal ideation.  Patients presenting with minimal risk factors; may be classified as minimal risk    Follow-up Information     [facility] Go to.   Specialty: Professional Counselor Why: You have an appointment on 06/21/22 at 10AM with Minna Merritts. Contact information: [address] [place] [address] [phone]                 Plan Of Care/Follow-up recommendations:  Other:  patient is stable for discharge today. Safety planning. Crisis resources.  Unnamed Zeien E Christina Waldrop, NP 06/18/2023, 10:04 AM
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
